# Patient Record
Sex: Female | Born: 1950 | Race: Black or African American | Hispanic: No | Marital: Married | Smoking: Former smoker
Health system: Southern US, Community
[De-identification: ages and names within clinical notes are randomized; demographics above are authoritative.]

## PROBLEM LIST (undated history)

## (undated) DIAGNOSIS — I10 Essential (primary) hypertension: Secondary | ICD-10-CM

## (undated) DIAGNOSIS — M5134 Other intervertebral disc degeneration, thoracic region: Secondary | ICD-10-CM

## (undated) DIAGNOSIS — M1990 Unspecified osteoarthritis, unspecified site: Secondary | ICD-10-CM

## (undated) DIAGNOSIS — M5124 Other intervertebral disc displacement, thoracic region: Secondary | ICD-10-CM

## (undated) HISTORY — PX: COLONOSCOPY: SHX174

## (undated) HISTORY — PX: BREAST BIOPSY: SHX20

---

## 2012-12-26 ENCOUNTER — Ambulatory Visit: Payer: Self-pay | Admitting: Gastroenterology

## 2013-10-25 DIAGNOSIS — M5030 Other cervical disc degeneration, unspecified cervical region: Secondary | ICD-10-CM | POA: Insufficient documentation

## 2013-10-25 DIAGNOSIS — M25561 Pain in right knee: Secondary | ICD-10-CM | POA: Insufficient documentation

## 2013-10-25 DIAGNOSIS — I10 Essential (primary) hypertension: Secondary | ICD-10-CM | POA: Insufficient documentation

## 2013-11-06 ENCOUNTER — Encounter: Payer: Self-pay | Admitting: Family Medicine

## 2013-11-16 ENCOUNTER — Encounter: Payer: Self-pay | Admitting: Family Medicine

## 2013-12-17 ENCOUNTER — Encounter: Payer: Self-pay | Admitting: Family Medicine

## 2014-09-06 ENCOUNTER — Other Ambulatory Visit: Payer: Self-pay | Admitting: Family Medicine

## 2014-09-06 DIAGNOSIS — Z1231 Encounter for screening mammogram for malignant neoplasm of breast: Secondary | ICD-10-CM

## 2014-09-11 ENCOUNTER — Ambulatory Visit
Admission: RE | Admit: 2014-09-11 | Discharge: 2014-09-11 | Disposition: A | Discharge status: Home / Self Care | Payer: Commercial Managed Care - HMO | Source: Ambulatory Visit | Attending: Family Medicine | Admitting: Family Medicine

## 2014-09-11 DIAGNOSIS — Z1231 Encounter for screening mammogram for malignant neoplasm of breast: Secondary | ICD-10-CM | POA: Insufficient documentation

## 2014-09-11 DIAGNOSIS — R922 Inconclusive mammogram: Secondary | ICD-10-CM | POA: Insufficient documentation

## 2014-09-12 ENCOUNTER — Ambulatory Visit: Payer: Self-pay

## 2014-09-12 ENCOUNTER — Ambulatory Visit: Admission: RE | Admit: 2014-09-12 | Payer: Commercial Managed Care - HMO | Source: Ambulatory Visit

## 2014-09-19 ENCOUNTER — Other Ambulatory Visit: Payer: Self-pay | Admitting: Family Medicine

## 2014-09-19 DIAGNOSIS — N63 Unspecified lump in unspecified breast: Secondary | ICD-10-CM

## 2014-09-19 DIAGNOSIS — R928 Other abnormal and inconclusive findings on diagnostic imaging of breast: Secondary | ICD-10-CM

## 2014-09-20 ENCOUNTER — Ambulatory Visit
Admission: RE | Admit: 2014-09-20 | Discharge: 2014-09-20 | Disposition: A | Discharge status: Home / Self Care | Payer: Commercial Managed Care - HMO | Source: Ambulatory Visit | Attending: Family Medicine | Admitting: Family Medicine

## 2014-09-20 DIAGNOSIS — N6002 Solitary cyst of left breast: Secondary | ICD-10-CM | POA: Diagnosis not present

## 2014-09-20 DIAGNOSIS — N63 Unspecified lump in unspecified breast: Secondary | ICD-10-CM

## 2014-09-20 DIAGNOSIS — R928 Other abnormal and inconclusive findings on diagnostic imaging of breast: Secondary | ICD-10-CM

## 2015-07-30 DIAGNOSIS — E669 Obesity, unspecified: Secondary | ICD-10-CM | POA: Insufficient documentation

## 2015-10-15 ENCOUNTER — Other Ambulatory Visit: Payer: Self-pay | Admitting: Family Medicine

## 2015-10-15 DIAGNOSIS — Z1231 Encounter for screening mammogram for malignant neoplasm of breast: Secondary | ICD-10-CM

## 2015-10-29 ENCOUNTER — Ambulatory Visit
Admission: RE | Admit: 2015-10-29 | Discharge: 2015-10-29 | Disposition: A | Discharge status: Home / Self Care | Payer: Commercial Managed Care - HMO | Source: Ambulatory Visit | Attending: Family Medicine | Admitting: Family Medicine

## 2015-10-29 DIAGNOSIS — Z1231 Encounter for screening mammogram for malignant neoplasm of breast: Secondary | ICD-10-CM

## 2016-11-18 ENCOUNTER — Other Ambulatory Visit: Payer: Self-pay | Admitting: Family Medicine

## 2016-11-18 DIAGNOSIS — Z1231 Encounter for screening mammogram for malignant neoplasm of breast: Secondary | ICD-10-CM

## 2016-12-03 DIAGNOSIS — N951 Menopausal and female climacteric states: Secondary | ICD-10-CM | POA: Insufficient documentation

## 2016-12-09 ENCOUNTER — Other Ambulatory Visit: Payer: Self-pay | Admitting: Family Medicine

## 2016-12-09 DIAGNOSIS — M5010 Cervical disc disorder with radiculopathy, unspecified cervical region: Secondary | ICD-10-CM

## 2016-12-15 ENCOUNTER — Ambulatory Visit
Admission: RE | Admit: 2016-12-15 | Discharge: 2016-12-15 | Disposition: A | Discharge status: Home / Self Care | Payer: Medicare Other | Source: Ambulatory Visit | Attending: Family Medicine | Admitting: Family Medicine

## 2016-12-15 DIAGNOSIS — Z1231 Encounter for screening mammogram for malignant neoplasm of breast: Secondary | ICD-10-CM | POA: Insufficient documentation

## 2016-12-23 DIAGNOSIS — R202 Paresthesia of skin: Secondary | ICD-10-CM

## 2016-12-23 DIAGNOSIS — R200 Anesthesia of skin: Secondary | ICD-10-CM | POA: Insufficient documentation

## 2016-12-23 DIAGNOSIS — M5412 Radiculopathy, cervical region: Secondary | ICD-10-CM | POA: Insufficient documentation

## 2017-01-12 ENCOUNTER — Ambulatory Visit
Admission: RE | Admit: 2017-01-12 | Discharge: 2017-01-12 | Disposition: A | Discharge status: Home / Self Care | Payer: Medicare Other | Source: Ambulatory Visit | Attending: Family Medicine | Admitting: Family Medicine

## 2017-01-12 DIAGNOSIS — M47812 Spondylosis without myelopathy or radiculopathy, cervical region: Secondary | ICD-10-CM | POA: Insufficient documentation

## 2017-01-12 DIAGNOSIS — M50221 Other cervical disc displacement at C4-C5 level: Secondary | ICD-10-CM | POA: Insufficient documentation

## 2017-01-12 DIAGNOSIS — M4802 Spinal stenosis, cervical region: Secondary | ICD-10-CM | POA: Diagnosis not present

## 2017-01-12 DIAGNOSIS — M5010 Cervical disc disorder with radiculopathy, unspecified cervical region: Secondary | ICD-10-CM

## 2017-11-08 ENCOUNTER — Other Ambulatory Visit: Payer: Self-pay | Admitting: Family Medicine

## 2017-11-08 DIAGNOSIS — Z1231 Encounter for screening mammogram for malignant neoplasm of breast: Secondary | ICD-10-CM

## 2017-12-21 ENCOUNTER — Ambulatory Visit
Admission: RE | Admit: 2017-12-21 | Discharge: 2017-12-21 | Disposition: A | Discharge status: Home / Self Care | Payer: Medicare Other | Source: Ambulatory Visit | Attending: Family Medicine | Admitting: Family Medicine

## 2017-12-21 DIAGNOSIS — Z1231 Encounter for screening mammogram for malignant neoplasm of breast: Secondary | ICD-10-CM | POA: Diagnosis not present

## 2017-12-31 ENCOUNTER — Ambulatory Visit
Admission: RE | Admit: 2017-12-31 | Discharge: 2017-12-31 | Disposition: A | Discharge status: Home / Self Care | Payer: Medicare Other | Source: Ambulatory Visit | Attending: Family Medicine | Admitting: Family Medicine

## 2017-12-31 ENCOUNTER — Other Ambulatory Visit: Payer: Self-pay | Admitting: Family Medicine

## 2017-12-31 DIAGNOSIS — R928 Other abnormal and inconclusive findings on diagnostic imaging of breast: Secondary | ICD-10-CM | POA: Insufficient documentation

## 2018-03-29 ENCOUNTER — Other Ambulatory Visit: Payer: Self-pay

## 2018-03-29 ENCOUNTER — Encounter: Payer: Self-pay | Admitting: Gastroenterology

## 2018-03-29 DIAGNOSIS — Z86010 Personal history of colonic polyps: Secondary | ICD-10-CM

## 2018-03-29 MED ORDER — NA SULFATE-K SULFATE-MG SULF 17.5-3.13-1.6 GM/177ML PO SOLN: 1.0000 | ORAL | 0 refills | Status: AC

## 2018-03-30 ENCOUNTER — Other Ambulatory Visit: Payer: Self-pay

## 2018-03-30 ENCOUNTER — Encounter: Payer: Self-pay | Admitting: *Deleted

## 2018-03-31 NOTE — Discharge Instructions (Signed)
General Anesthesia, Adult, Care After  This sheet gives you information about how to care for yourself after your procedure. Your health care provider may also give you more specific instructions. If you have problems or questions, contact your health care provider.  What can I expect after the procedure?  After the procedure, the following side effects are common:  Pain or discomfort at the IV site.  Nausea.  Vomiting.  Sore throat.  Trouble concentrating.  Feeling cold or chills.  Weak or tired.  Sleepiness and fatigue.  Soreness and body aches. These side effects can affect parts of the body that were not involved in surgery.  Follow these instructions at home:    For at least 24 hours after the procedure:  Have a responsible adult stay with you. It is important to have someone help care for you until you are awake and alert.  Rest as needed.  Do not:  Participate in activities in which you could fall or become injured.  Drive.  Use heavy machinery.  Drink alcohol.  Take sleeping pills or medicines that cause drowsiness.  Make important decisions or sign legal documents.  Take care of children on your own.  Eating and drinking  Follow any instructions from your health care provider about eating or drinking restrictions.  When you feel hungry, start by eating small amounts of foods that are soft and easy to digest (bland), such as toast. Gradually return to your regular diet.  Drink enough fluid to keep your urine pale yellow.  If you vomit, rehydrate by drinking water, juice, or clear broth.  General instructions  If you have sleep apnea, surgery and certain medicines can increase your risk for breathing problems. Follow instructions from your health care provider about wearing your sleep device:  Anytime you are sleeping, including during daytime naps.  While taking prescription pain medicines, sleeping medicines, or medicines that make you drowsy.  Return to your normal activities as told by your health care  provider. Ask your health care provider what activities are safe for you.  Take over-the-counter and prescription medicines only as told by your health care provider.  If you smoke, do not smoke without supervision.  Keep all follow-up visits as told by your health care provider. This is important.  Contact a health care provider if:  You have nausea or vomiting that does not get better with medicine.  You cannot eat or drink without vomiting.  You have pain that does not get better with medicine.  You are unable to pass urine.  You develop a skin rash.  You have a fever.  You have redness around your IV site that gets worse.  Get help right away if:  You have difficulty breathing.  You have chest pain.  You have blood in your urine or stool, or you vomit blood.  Summary  After the procedure, it is common to have a sore throat or nausea. It is also common to feel tired.  Have a responsible adult stay with you for the first 24 hours after general anesthesia. It is important to have someone help care for you until you are awake and alert.  When you feel hungry, start by eating small amounts of foods that are soft and easy to digest (bland), such as toast. Gradually return to your regular diet.  Drink enough fluid to keep your urine pale yellow.  Return to your normal activities as told by your health care provider. Ask your health care   provider what activities are safe for you.  This information is not intended to replace advice given to you by your health care provider. Make sure you discuss any questions you have with your health care provider.  Document Released: 05/11/2000 Document Revised: 09/18/2016 Document Reviewed: 09/18/2016  Elsevier Interactive Patient Education  2019 [employer]

## 2018-04-04 ENCOUNTER — Encounter
Admission: RE | Disposition: A | Discharge status: Home / Self Care | Payer: Self-pay | Source: Home / Self Care | Attending: Gastroenterology

## 2018-04-04 ENCOUNTER — Ambulatory Visit: Payer: Medicare Other | Admitting: Anesthesiology

## 2018-04-04 ENCOUNTER — Ambulatory Visit
Admission: RE | Admit: 2018-04-04 | Discharge: 2018-04-04 | Disposition: A | Discharge status: Home / Self Care | Payer: Medicare Other | Attending: Gastroenterology | Admitting: Gastroenterology

## 2018-04-04 DIAGNOSIS — I10 Essential (primary) hypertension: Secondary | ICD-10-CM | POA: Diagnosis not present

## 2018-04-04 DIAGNOSIS — Z1211 Encounter for screening for malignant neoplasm of colon: Secondary | ICD-10-CM | POA: Diagnosis not present

## 2018-04-04 DIAGNOSIS — Z791 Long term (current) use of non-steroidal anti-inflammatories (NSAID): Secondary | ICD-10-CM | POA: Diagnosis not present

## 2018-04-04 DIAGNOSIS — M170 Bilateral primary osteoarthritis of knee: Secondary | ICD-10-CM | POA: Diagnosis not present

## 2018-04-04 DIAGNOSIS — Z87891 Personal history of nicotine dependence: Secondary | ICD-10-CM | POA: Diagnosis not present

## 2018-04-04 DIAGNOSIS — Z860100 Personal history of colon polyps, unspecified: Secondary | ICD-10-CM

## 2018-04-04 DIAGNOSIS — Z79899 Other long term (current) drug therapy: Secondary | ICD-10-CM | POA: Diagnosis not present

## 2018-04-04 DIAGNOSIS — G709 Myoneural disorder, unspecified: Secondary | ICD-10-CM | POA: Insufficient documentation

## 2018-04-04 HISTORY — DX: Other intervertebral disc degeneration, thoracic region: M51.34

## 2018-04-04 HISTORY — DX: Essential (primary) hypertension: I10

## 2018-04-04 HISTORY — DX: Unspecified osteoarthritis, unspecified site: M19.90

## 2018-04-04 HISTORY — PX: COLONOSCOPY WITH PROPOFOL: SHX5780

## 2018-04-04 HISTORY — DX: Other intervertebral disc displacement, thoracic region: M51.24

## 2018-04-04 SURGERY — COLONOSCOPY WITH PROPOFOL
Anesthesia: General | Site: Rectum

## 2018-04-04 MED ORDER — ACETAMINOPHEN 160 MG/5ML PO SOLN: 325.0000 mg | Freq: Once | ORAL | Status: DC

## 2018-04-04 MED ORDER — LIDOCAINE HCL (CARDIAC) PF 100 MG/5ML IV SOSY
PREFILLED_SYRINGE | INTRAVENOUS | Status: DC | PRN
  Administered 2018-04-04: 30 mg via INTRAVENOUS

## 2018-04-04 MED ORDER — PROPOFOL 10 MG/ML IV BOLUS
INTRAVENOUS | Status: DC | PRN
  Administered 2018-04-04: 100 mg via INTRAVENOUS
  Administered 2018-04-04 (×2): 50 mg via INTRAVENOUS
  Administered 2018-04-04: 40 mg via INTRAVENOUS

## 2018-04-04 MED ORDER — STERILE WATER FOR IRRIGATION IR SOLN
Status: DC | PRN
  Administered 2018-04-04: 10:00:00

## 2018-04-04 MED ORDER — LACTATED RINGERS IV SOLN
INTRAVENOUS | Status: DC
  Administered 2018-04-04: 09:00:00 via INTRAVENOUS

## 2018-04-04 MED ORDER — ACETAMINOPHEN 325 MG PO TABS: 325.0000 mg | ORAL_TABLET | Freq: Once | ORAL | Status: DC

## 2018-04-04 MED ORDER — SODIUM CHLORIDE 0.9 % IV SOLN: INTRAVENOUS | Status: DC

## 2018-04-04 SURGICAL SUPPLY — 5 items
CANISTER SUCT 1200ML W/VALVE (MISCELLANEOUS) ×3 IMPLANT
GOWN CVR UNV OPN BCK APRN NK (MISCELLANEOUS) ×2 IMPLANT
GOWN ISOL THUMB LOOP REG UNIV (MISCELLANEOUS) ×4
KIT ENDO PROCEDURE OLY (KITS) ×3 IMPLANT
WATER STERILE IRR 250ML POUR (IV SOLUTION) ×3 IMPLANT

## 2018-04-04 NOTE — Op Note (Signed)
[facility] Patient Name: Stacy Callahan Procedure Date: 04/04/2018 9:44 AM MRN: [id number] Account #: [id number] Date of Birth: Aug 28, 1950 Admit Type: Outpatient Age: 68 Room: [facility] OR ROOM 01 Gender: Female Note Status: Finalized Procedure:            Colonoscopy Indications:          High risk colon cancer surveillance: Personal history                        of colonic polyps Providers:            Midge Minium MD, MD Referring MD:         Melanee Spry. Ether Griffins (Referring MD) Medicines:            Propofol per Anesthesia Complications:        No immediate complications. Procedure:            Pre-Anesthesia Assessment:                       - Prior to the procedure, a History and Physical was                        performed, and patient medications and allergies were                        reviewed. The patient's tolerance of previous                        anesthesia was also reviewed. The risks and benefits of                        the procedure and the sedation options and risks were                        discussed with the patient. All questions were                        answered, and informed consent was obtained. Prior                        Anticoagulants: The patient has taken no previous                        anticoagulant or antiplatelet agents. ASA Grade                        Assessment: II - A patient with mild systemic disease.                        After reviewing the risks and benefits, the patient was                        deemed in satisfactory condition to undergo the                        procedure.                       After obtaining informed consent, the colonoscope was  passed under direct vision. Throughout the procedure,                        the patient's blood pressure, pulse, and oxygen                        saturations were monitored continuously. The                        Colonoscope was  introduced through the anus and                        advanced to the the cecum, identified by appendiceal                        orifice and ileocecal valve. The colonoscopy was                        performed without difficulty. The patient tolerated the                        procedure well. The quality of the bowel preparation                        was excellent. Findings:      The perianal and digital rectal examinations were normal.      The colon (entire examined portion) appeared normal. Impression:           - The entire examined colon is normal.                       - No specimens collected. Recommendation:       - Discharge patient to home.                       - Resume previous diet.                       - Continue present medications.                       - Repeat colonoscopy in 5 years for surveillance. Procedure Code(s):    --- Professional ---                       [phone] [id number], Colonoscopy, flexible; diagnostic, including                        collection of specimen(s) by brushing or washing, when                        performed (separate procedure) Diagnosis Code(s):    --- Professional ---                       Z86.010, Personal history of colonic polyps CPT copyright 2018 American Medical Association. All rights reserved. The codes documented in this report are preliminary and upon coder review may  be revised to meet current compliance requirements. Midge Miniumarren Danika Kluender MD, MD 04/04/2018 10:04:42 AM This report has been signed electronically. Number of Addenda: 0 Note Initiated On: 04/04/2018 9:44 AM Scope Withdrawal Time: 0 hours 6 minutes 33 seconds  Total Procedure  Duration: 0 hours 11 minutes 59 seconds       [facility]

## 2018-04-04 NOTE — Transfer of Care (Signed)
Immediate Anesthesia Transfer of Care Note  Patient: Stacy Callahan  Procedure(s) Performed: COLONOSCOPY WITH PROPOFOL (N/A Rectum)  Patient Location: PACU  Anesthesia Type: General  Level of Consciousness: awake, alert  and patient cooperative  Airway and Oxygen Therapy: Patient Spontanous Breathing and Patient connected to supplemental oxygen  Post-op Assessment: Post-op Vital signs reviewed, Patient's Cardiovascular Status Stable, Respiratory Function Stable, Patent Airway and No signs of Nausea or vomiting  Post-op Vital Signs: Reviewed and stable  Complications: No apparent anesthesia complications

## 2018-04-04 NOTE — Anesthesia Procedure Notes (Signed)
Date/Time: 04/04/2018 9:48 AM Performed by: Maree Krabbe, CRNA Pre-anesthesia Checklist: Patient identified, Emergency Drugs available, Suction available, Timeout performed and Patient being monitored Patient Re-evaluated:Patient Re-evaluated prior to induction Oxygen Delivery Method: Nasal cannula Placement Confirmation: positive ETCO2

## 2018-04-04 NOTE — Anesthesia Preprocedure Evaluation (Signed)
Anesthesia Evaluation  Patient identified by MRN, date of birth, ID band Patient awake    Reviewed: Allergy & Precautions, H&P , NPO status , Patient's Chart, lab work & pertinent test results  Airway Mallampati: II  TM Distance: >3 FB Neck ROM: full    Dental no notable dental hx.    Pulmonary former smoker,    Pulmonary exam normal breath sounds clear to auscultation       Cardiovascular hypertension, Normal cardiovascular exam Rhythm:regular Rate:Normal     Neuro/Psych  Neuromuscular disease    GI/Hepatic   Endo/Other    Renal/GU      Musculoskeletal   Abdominal   Peds  Hematology   Anesthesia Other Findings   Reproductive/Obstetrics                             Anesthesia Physical Anesthesia Plan  ASA: II  Anesthesia Plan: General   Post-op Pain Management:    Induction: Intravenous  PONV Risk Score and Plan: 3 and Propofol infusion and Treatment may vary due to age or medical condition  Airway Management Planned: Natural Airway  Additional Equipment:   Intra-op Plan:   Post-operative Plan:   Informed Consent: I have reviewed the patients History and Physical, chart, labs and discussed the procedure including the risks, benefits and alternatives for the proposed anesthesia with the patient or authorized representative who has indicated his/her understanding and acceptance.       Plan Discussed with: CRNA  Anesthesia Plan Comments:         Anesthesia Quick Evaluation

## 2018-04-04 NOTE — H&P (Signed)
 Darren Wohl, MD FACG [address] [place], [place] [address] Phone:[phone] Fax : [phone]  Primary Care Physician:  Fowler, Vickie A, MD Primary Gastroenterologist:  Dr. Wohl  Pre-Procedure History & Physical: HPI:  Stacy Callahan is a 68 y.o. female is here for an colonoscopy.   Past Medical History:  Diagnosis Date  . Arthritis    knees  . Bulging of thoracic intervertebral disc   . Hypertension     Past Surgical History:  Procedure Laterality Date  . COLONOSCOPY      Prior to Admission medications   Medication Sig Start Date End Date Taking? Authorizing Provider  BIOTIN PO Take by mouth daily.   Yes [provider]  carbamide peroxide (DEBROX) 6.5 % OTIC solution Place in ear(s). 03/23/18 04/03/19 Yes [provider]  gabapentin (NEURONTIN) 100 MG capsule as needed.  11/18/17  Yes [provider]  losartan-hydrochlorothiazide (HYZAAR) 100-25 MG tablet TK 1 T PO QD 12/02/17  Yes [provider]  NAPROXEN DR 500 MG EC tablet  11/08/17  Yes [provider]  Probiotic Product (PROBIOTIC DAILY PO) Take by mouth daily.   Yes [provider]  topiramate (TOPAMAX) 25 MG tablet Take 1 po daily to bid to suppress appetite/obesity 12/03/16  Yes [provider]  Vitamin D, Ergocalciferol, (DRISDOL) 1.25 MG (50000 UT) CAPS capsule Take by mouth. 03/23/18  Yes [provider]  gabapentin (NEURONTIN) 300 MG capsule One po nightly for sleep disturbance and up to tid if needed for pinched nerve 03/23/18   [provider]  Na Sulfate-K Sulfate-Mg Sulf (SUPREP BOWEL PREP KIT) 17.5-3.13-1.6 GM/177ML SOLN Take 1 kit by mouth as directed. 03/29/18   Wohl, Darren, MD    Allergies as of 03/29/2018  . (Not on File)    Family History  Adopted: Yes  Problem Relation Age of Onset  . Breast cancer Neg Hx     Social History   Socioeconomic History  . Marital status: Married    Spouse name: Not on file    . Number of children: Not on file  . Years of education: Not on file  . Highest education level: Not on file  Occupational History  . Not on file  Social Needs  . Financial resource strain: Not on file  . Food insecurity:    Worry: Not on file    Inability: Not on file  . Transportation needs:    Medical: Not on file    Non-medical: Not on file  Tobacco Use  . Smoking status: Former Smoker    Last attempt to quit: 1990    Years since quitting: 30.1  . Smokeless tobacco: Never Used  Substance and Sexual Activity  . Alcohol use: Not Currently  . Drug use: Not on file  . Sexual activity: Not on file  Lifestyle  . Physical activity:    Days per week: Not on file    Minutes per session: Not on file  . Stress: Not on file  Relationships  . Social connections:    Talks on phone: Not on file    Gets together: Not on file    Attends religious service: Not on file    Active member of club or organization: Not on file    Attends meetings of clubs or organizations: Not on file    Relationship status: Not on file  . Intimate partner violence:    Fear of current or ex partner: Not on file      Emotionally abused: Not on file    Physically abused: Not on file    Forced sexual activity: Not on file  Other Topics Concern  . Not on file  Social History Narrative  . Not on file    Review of Systems: See HPI, otherwise negative ROS  Physical Exam: Ht 5' 4" (1.626 m)   Wt 77.1 kg   BMI 29.18 kg/m  General:   Alert,  pleasant and cooperative in NAD Head:  Normocephalic and atraumatic. Neck:  Supple; no masses or thyromegaly. Lungs:  Clear throughout to auscultation.    Heart:  Regular rate and rhythm. Abdomen:  Soft, nontender and nondistended. Normal bowel sounds, without guarding, and without rebound.   Neurologic:  Alert and  oriented x4;  grossly normal neurologically.  Impression/Plan: Janalyn Kydd is here for an colonoscopy to be performed for history of polyps  Risks,  benefits, limitations, and alternatives regarding  colonoscopy have been reviewed with the patient.  Questions have been answered.  All parties agreeable.   Darren Wohl, MD  04/04/2018, 9:16 AM 

## 2018-04-04 NOTE — Anesthesia Postprocedure Evaluation (Signed)
Anesthesia Post Note  Patient: Stacy Callahan  Procedure(s) Performed: COLONOSCOPY WITH PROPOFOL (N/A Rectum)  Patient location during evaluation: PACU Anesthesia Type: General Level of consciousness: awake and alert and oriented Pain management: satisfactory to patient Vital Signs Assessment: post-procedure vital signs reviewed and stable Respiratory status: spontaneous breathing, nonlabored ventilation and respiratory function stable Cardiovascular status: blood pressure returned to baseline and stable Postop Assessment: Adequate PO intake and No signs of nausea or vomiting Anesthetic complications: no    Cherly [place]

## 2018-04-05 ENCOUNTER — Encounter: Payer: Self-pay | Admitting: Gastroenterology

## 2018-04-07 ENCOUNTER — Other Ambulatory Visit: Payer: Self-pay | Admitting: Family Medicine

## 2018-04-07 DIAGNOSIS — N951 Menopausal and female climacteric states: Secondary | ICD-10-CM

## 2018-12-27 ENCOUNTER — Other Ambulatory Visit: Payer: Self-pay | Admitting: Family Medicine

## 2018-12-27 DIAGNOSIS — Z1231 Encounter for screening mammogram for malignant neoplasm of breast: Secondary | ICD-10-CM

## 2019-01-04 ENCOUNTER — Ambulatory Visit
Admission: RE | Admit: 2019-01-04 | Discharge: 2019-01-04 | Disposition: A | Discharge status: Home / Self Care | Payer: Medicare Other | Source: Ambulatory Visit | Attending: Family Medicine | Admitting: Family Medicine

## 2019-01-04 ENCOUNTER — Other Ambulatory Visit: Payer: Self-pay

## 2019-01-04 DIAGNOSIS — Z780 Asymptomatic menopausal state: Secondary | ICD-10-CM | POA: Diagnosis not present

## 2019-01-04 DIAGNOSIS — N951 Menopausal and female climacteric states: Secondary | ICD-10-CM | POA: Insufficient documentation

## 2019-01-04 DIAGNOSIS — Z13820 Encounter for screening for osteoporosis: Secondary | ICD-10-CM | POA: Insufficient documentation

## 2019-01-04 DIAGNOSIS — Z1231 Encounter for screening mammogram for malignant neoplasm of breast: Secondary | ICD-10-CM | POA: Insufficient documentation

## 2019-01-10 ENCOUNTER — Other Ambulatory Visit: Payer: Self-pay | Admitting: Family Medicine

## 2019-01-10 DIAGNOSIS — R928 Other abnormal and inconclusive findings on diagnostic imaging of breast: Secondary | ICD-10-CM

## 2019-01-11 ENCOUNTER — Ambulatory Visit
Admission: RE | Admit: 2019-01-11 | Discharge: 2019-01-11 | Disposition: A | Discharge status: Home / Self Care | Payer: Medicare Other | Source: Ambulatory Visit | Attending: Family Medicine | Admitting: Family Medicine

## 2019-01-11 DIAGNOSIS — R928 Other abnormal and inconclusive findings on diagnostic imaging of breast: Secondary | ICD-10-CM | POA: Diagnosis present

## 2019-01-17 ENCOUNTER — Other Ambulatory Visit: Payer: Self-pay | Admitting: Family Medicine

## 2019-01-17 DIAGNOSIS — R928 Other abnormal and inconclusive findings on diagnostic imaging of breast: Secondary | ICD-10-CM

## 2019-01-20 ENCOUNTER — Ambulatory Visit
Admission: RE | Admit: 2019-01-20 | Discharge: 2019-01-20 | Disposition: A | Discharge status: Home / Self Care | Payer: Medicare Other | Source: Ambulatory Visit | Attending: Family Medicine | Admitting: Family Medicine

## 2019-01-20 DIAGNOSIS — R928 Other abnormal and inconclusive findings on diagnostic imaging of breast: Secondary | ICD-10-CM | POA: Insufficient documentation

## 2021-10-20 IMAGING — US US BREAST*L* LIMITED INC AXILLA
1 series · 10 of 10 positions shown · non-contrast
Comparison: Previous exam(s).

CLINICAL DATA: Patient was called back from screening mammogram for
possible distortion in the left breast. Patient has a sebaceous cyst
in the left axilla (which she states has been present for several
years).

EXAM:
DIGITAL DIAGNOSTIC LEFT MAMMOGRAM WITH TOMO
ULTRASOUND LEFT BREAST

[Series 1: us breast*left* limited inc axilla · 0.07mm/px · 10 of 10 slices shown]
[im 1/10]
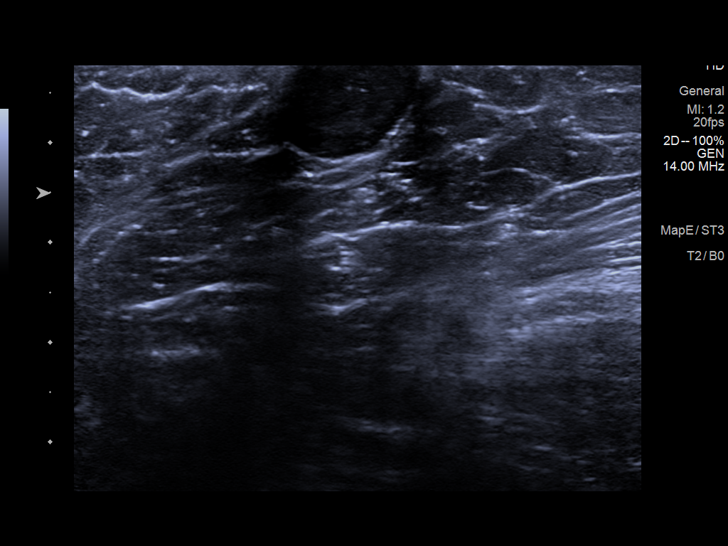
[im 2/10]
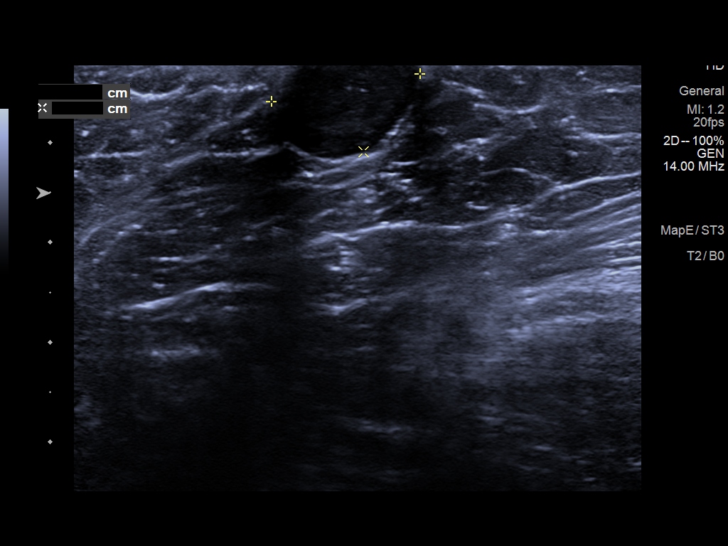
[im 3/10]
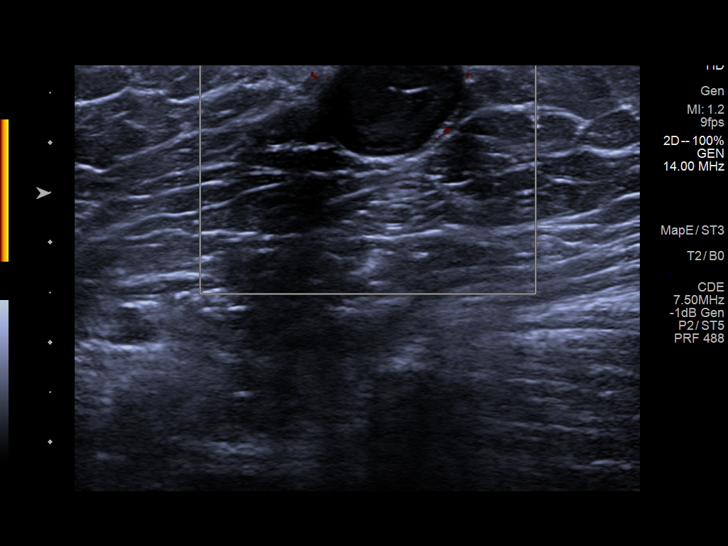
[im 4/10]
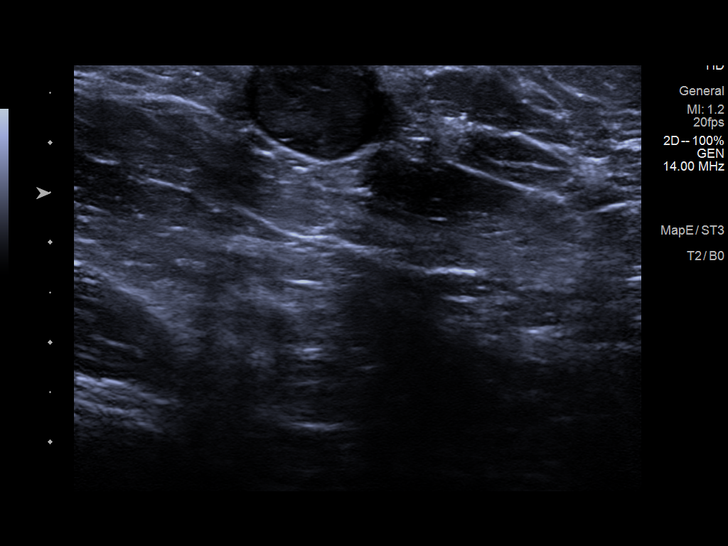
[im 5/10]
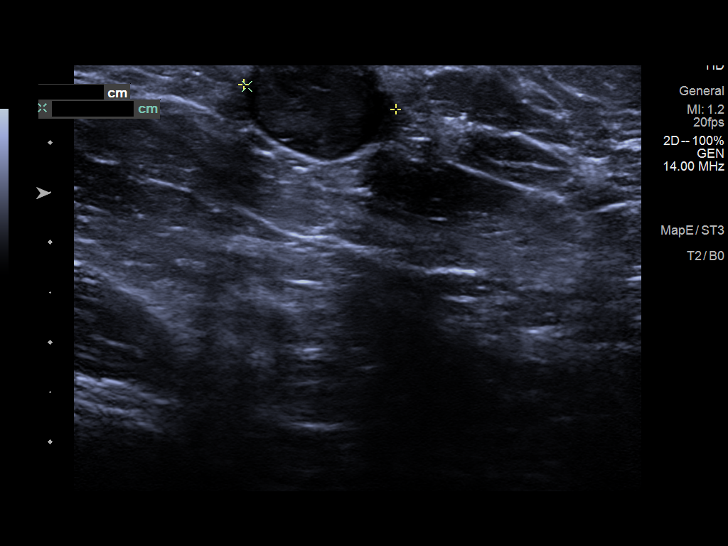
[im 6/10]
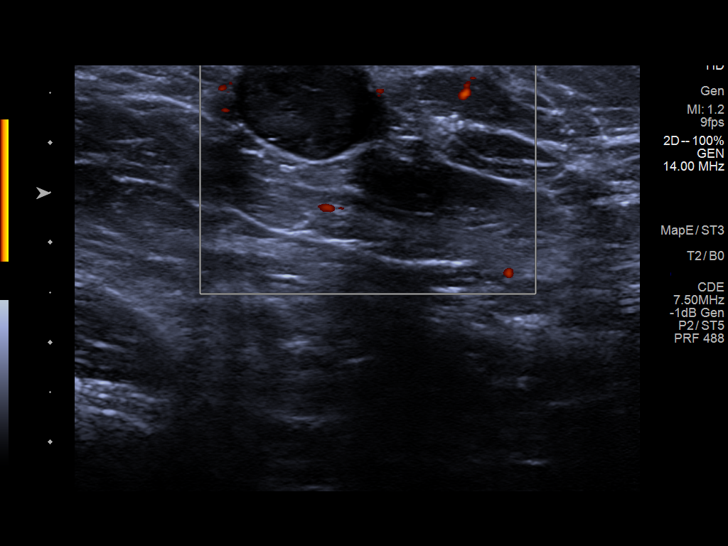
[im 7/10]
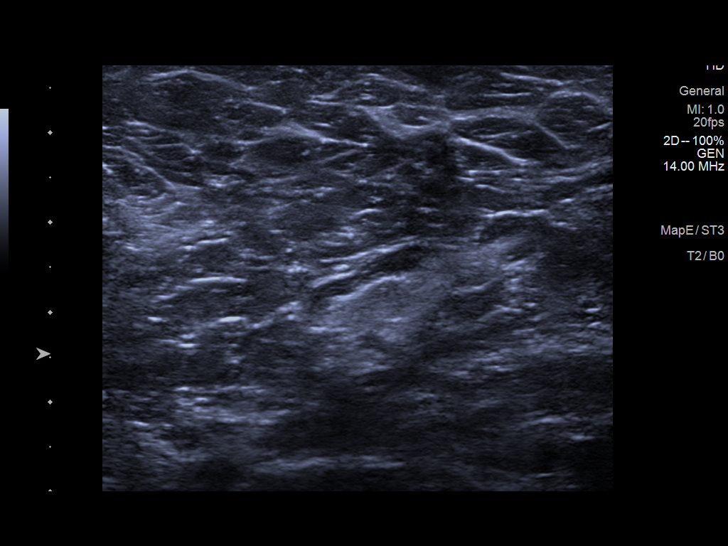
[im 8/10]
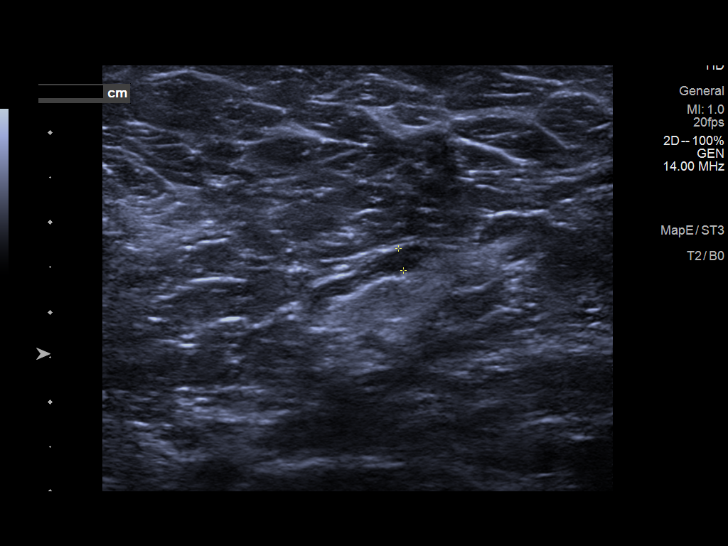
[im 9/10]
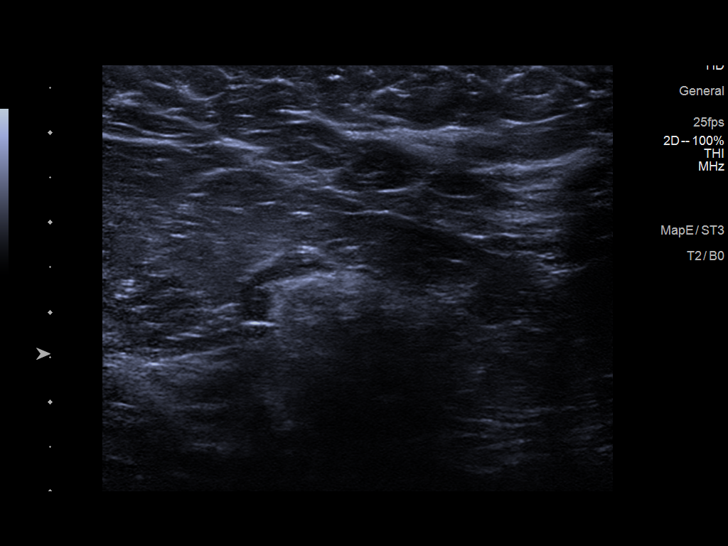
[im 10/10]
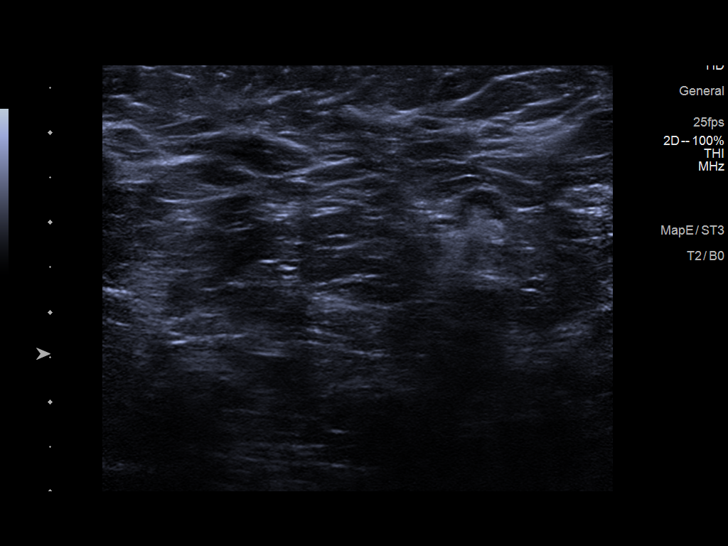

[10 of 10 positions shown; findings below may reference images not displayed]

ACR Breast Density Category b: There are scattered areas of
fibroglandular density.
FINDINGS: Additional imaging of the left breast was performed. There is
persistence of subtle distortion in the upper-outer quadrant of the
breast. There are no malignant type microcalcifications.

On physical exam, I do not palpate a mass in the upper-outer
quadrant of the left breast. There is a superficial palpable mass in
the left axilla with a black opening to the skin surface consistent
with a benign sebaceous cyst.

Targeted ultrasound is performed, showing no suspicious mass,
distortion or abnormal shadowing in the upper-outer quadrant of the
left breast. There is a well-circumscribed superficial hypoechoic
mass in the axilla measuring 1.5 x 1.1 x 1.5 cm. A visible opening
is seen to the skin surface. This is consistent with a sebaceous
cyst. There is no enlarged axillary adenopathy.
IMPRESSION: Subtle distortion in the upper-outer quadrant of the left breast.

RECOMMENDATION:
Stereotactic biopsy of the distortion in the upper-outer quadrant of
the left breast is recommended.

I have discussed the findings and recommendations with the patient.
If applicable, a reminder letter will be sent to the patient
regarding the next appointment.

BI-RADS CATEGORY  4: Suspicious.

## 2021-10-20 IMAGING — MG MM DIGITAL DIAGNOSTIC UNILAT*L* W/ TOMO W/ CAD
6 series · 6 of 18 positions shown · non-contrast
Comparison: Previous exam(s).

CLINICAL DATA: Patient was called back from screening mammogram for
possible distortion in the left breast. Patient has a sebaceous cyst
in the left axilla (which she states has been present for several
years).

EXAM:
DIGITAL DIAGNOSTIC LEFT MAMMOGRAM WITH TOMO
ULTRASOUND LEFT BREAST

[L MLO synth-2D (1 of 2)]
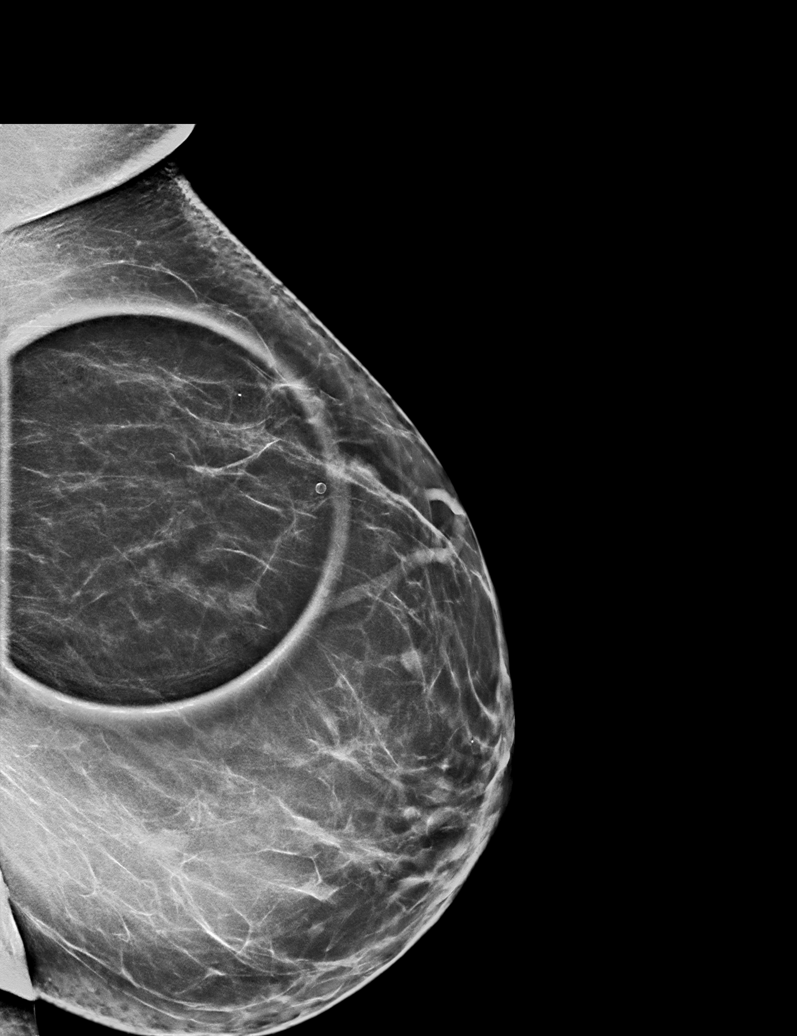

[L CC synth-2D]
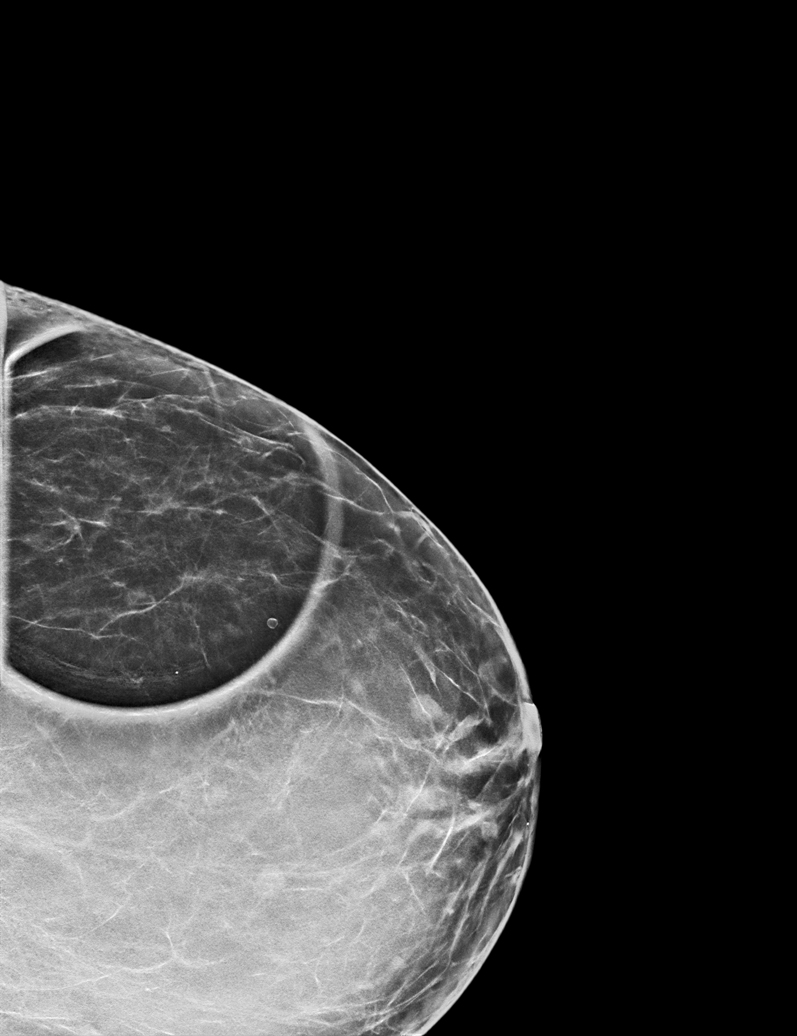

[L MLO synth-2D (2 of 2)]
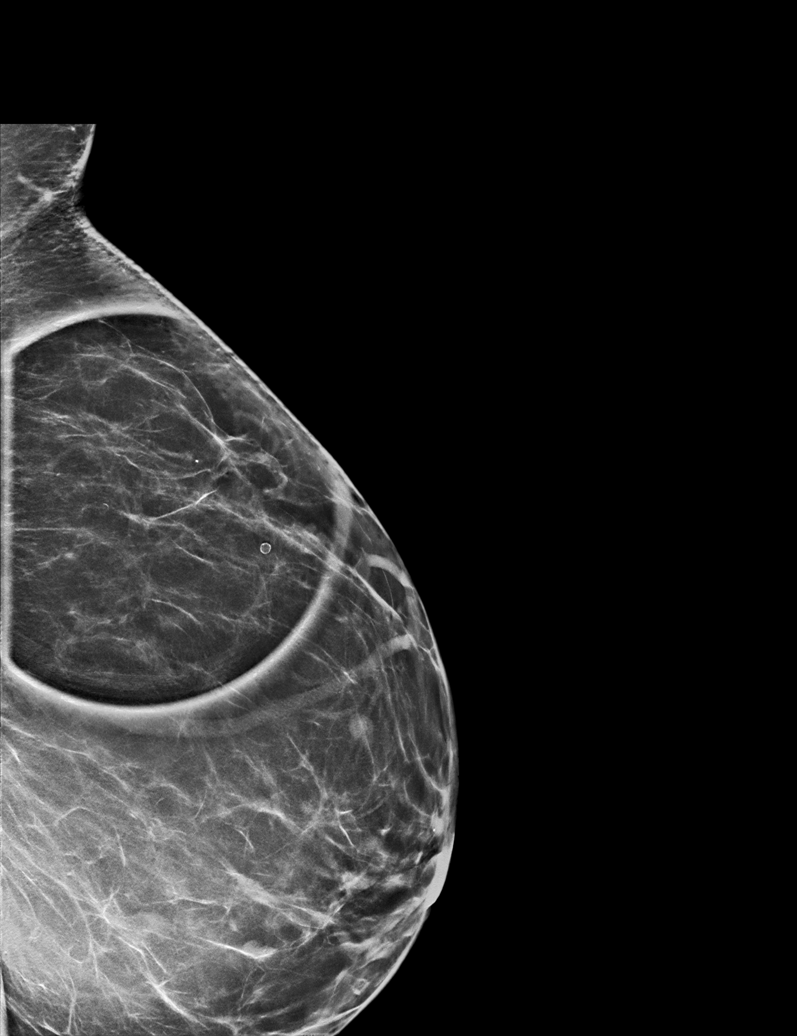

[L MLO tomo (1 of 2) · tomo slice 37/74.0]
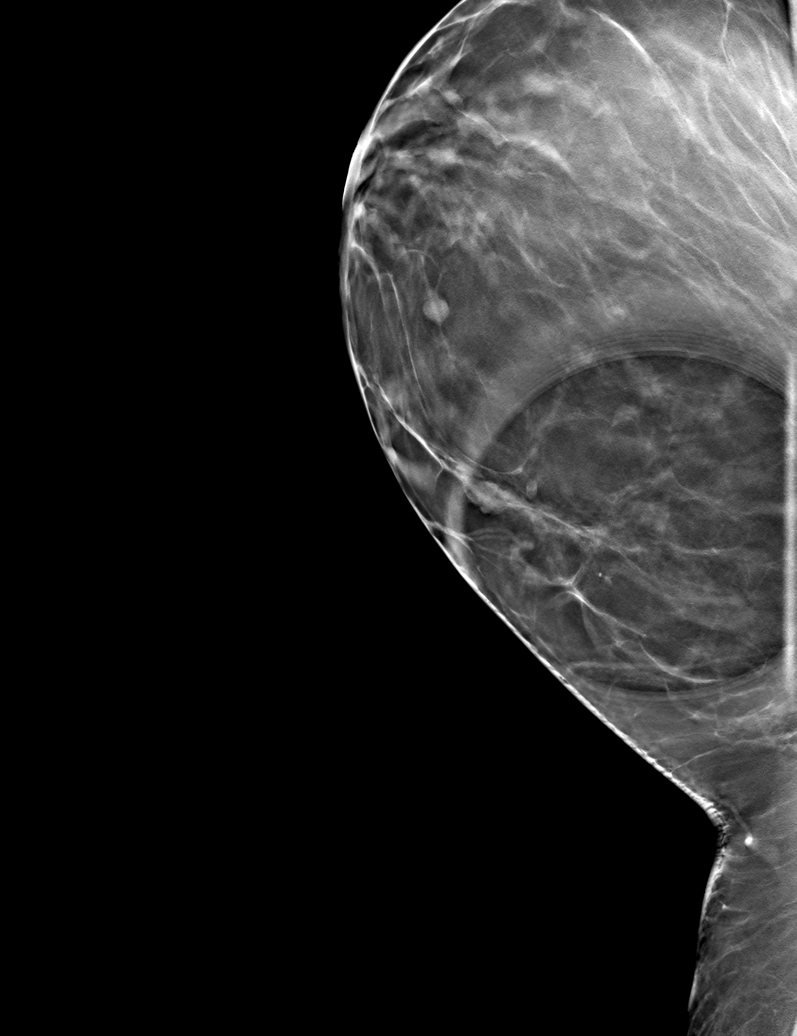

[L MLO tomo (2 of 2) · tomo slice 37/74.0]
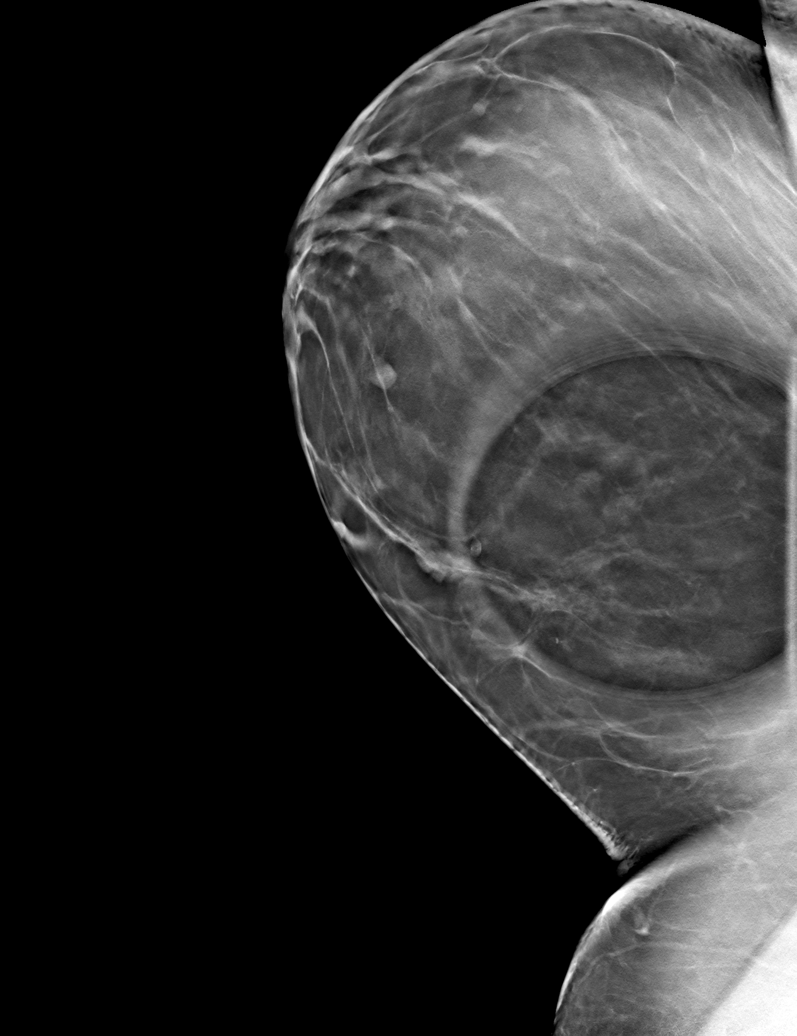

[L CC tomo · tomo slice 32/63.0]
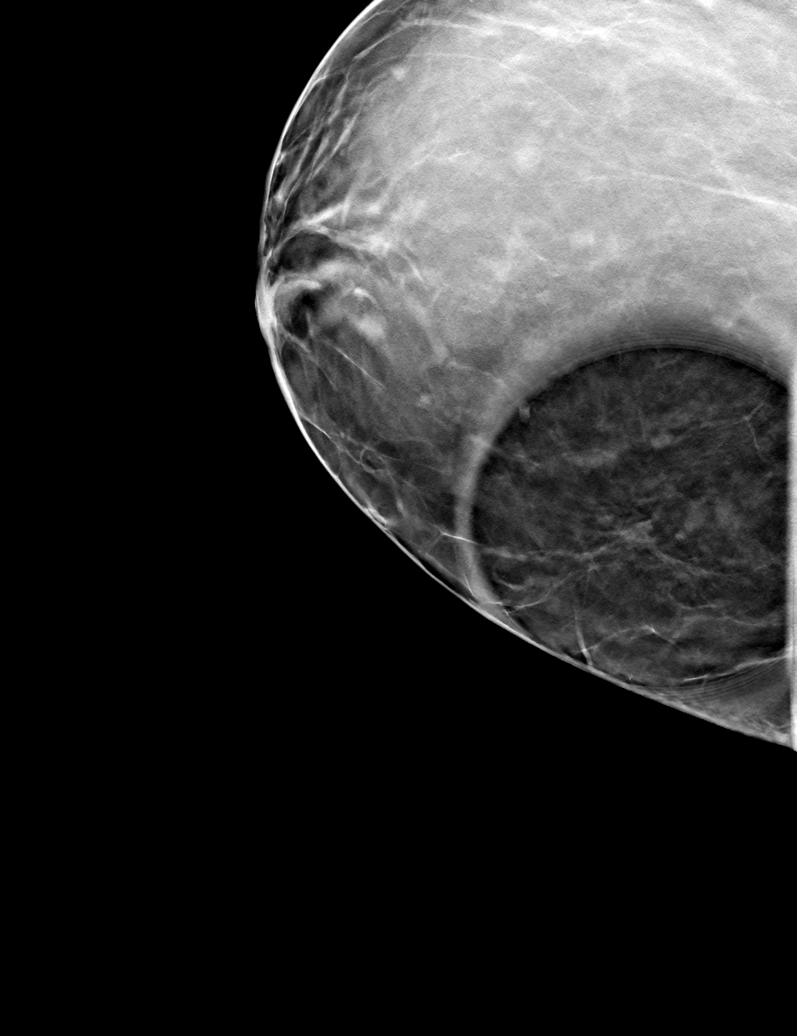

[6 of 18 positions shown; findings below may reference images not displayed]

ACR Breast Density Category b: There are scattered areas of
fibroglandular density.
FINDINGS: Additional imaging of the left breast was performed. There is
persistence of subtle distortion in the upper-outer quadrant of the
breast. There are no malignant type microcalcifications.

On physical exam, I do not palpate a mass in the upper-outer
quadrant of the left breast. There is a superficial palpable mass in
the left axilla with a black opening to the skin surface consistent
with a benign sebaceous cyst.

Targeted ultrasound is performed, showing no suspicious mass,
distortion or abnormal shadowing in the upper-outer quadrant of the
left breast. There is a well-circumscribed superficial hypoechoic
mass in the axilla measuring 1.5 x 1.1 x 1.5 cm. A visible opening
is seen to the skin surface. This is consistent with a sebaceous
cyst. There is no enlarged axillary adenopathy.
IMPRESSION: Subtle distortion in the upper-outer quadrant of the left breast.

RECOMMENDATION:
Stereotactic biopsy of the distortion in the upper-outer quadrant of
the left breast is recommended.

I have discussed the findings and recommendations with the patient.
If applicable, a reminder letter will be sent to the patient
regarding the next appointment.

BI-RADS CATEGORY  4: Suspicious.
